# Patient Record
Sex: Male | Born: 1990 | Race: Black or African American | Hispanic: No | Marital: Single
Health system: Southern US, Community
[De-identification: ages and names within clinical notes are randomized; demographics above are authoritative.]

---

## 2012-01-27 ENCOUNTER — Ambulatory Visit (HOSPITAL_COMMUNITY)
Admission: RE | Admit: 2012-01-27 | Discharge: 2012-01-27 | Disposition: A | Discharge status: Home / Self Care | Payer: Commercial Managed Care - PPO | Source: Ambulatory Visit | Attending: Family Medicine | Admitting: Family Medicine

## 2012-01-27 ENCOUNTER — Other Ambulatory Visit: Payer: Self-pay | Admitting: Family Medicine

## 2012-01-27 DIAGNOSIS — M899 Disorder of bone, unspecified: Secondary | ICD-10-CM | POA: Insufficient documentation

## 2012-01-27 DIAGNOSIS — M25562 Pain in left knee: Secondary | ICD-10-CM

## 2012-01-27 DIAGNOSIS — X58XXXA Exposure to other specified factors, initial encounter: Secondary | ICD-10-CM | POA: Insufficient documentation

## 2012-01-27 DIAGNOSIS — M25469 Effusion, unspecified knee: Secondary | ICD-10-CM | POA: Insufficient documentation

## 2012-01-27 DIAGNOSIS — M7120 Synovial cyst of popliteal space [Baker], unspecified knee: Secondary | ICD-10-CM | POA: Insufficient documentation

## 2012-01-27 DIAGNOSIS — M84369A Stress fracture, unspecified tibia and fibula, initial encounter for fracture: Secondary | ICD-10-CM | POA: Insufficient documentation

## 2012-01-27 DIAGNOSIS — R609 Edema, unspecified: Secondary | ICD-10-CM | POA: Insufficient documentation

## 2012-01-27 DIAGNOSIS — M949 Disorder of cartilage, unspecified: Secondary | ICD-10-CM | POA: Insufficient documentation

## 2014-05-20 ENCOUNTER — Ambulatory Visit: Payer: Self-pay

## 2014-05-21 ENCOUNTER — Other Ambulatory Visit (HOSPITAL_COMMUNITY)
Admission: RE | Admit: 2014-05-21 | Discharge: 2014-05-21 | Disposition: A | Discharge status: Home / Self Care | Payer: Commercial Managed Care - PPO | Source: Ambulatory Visit | Attending: Family Medicine | Admitting: Family Medicine

## 2014-05-21 DIAGNOSIS — Z113 Encounter for screening for infections with a predominantly sexual mode of transmission: Secondary | ICD-10-CM | POA: Diagnosis present

## 2015-02-07 ENCOUNTER — Emergency Department (HOSPITAL_COMMUNITY): Payer: BLUE CROSS/BLUE SHIELD

## 2015-02-07 ENCOUNTER — Emergency Department (HOSPITAL_COMMUNITY)
Admission: EM | Admit: 2015-02-07 | Discharge: 2015-02-07 | Disposition: A | Discharge status: Home / Self Care | Payer: BLUE CROSS/BLUE SHIELD | Attending: Emergency Medicine | Admitting: Emergency Medicine

## 2015-02-07 ENCOUNTER — Encounter (HOSPITAL_COMMUNITY): Payer: Self-pay | Admitting: *Deleted

## 2015-02-07 DIAGNOSIS — W3400XA Accidental discharge from unspecified firearms or gun, initial encounter: Secondary | ICD-10-CM | POA: Insufficient documentation

## 2015-02-07 DIAGNOSIS — S023XXA Fracture of orbital floor, initial encounter for closed fracture: Secondary | ICD-10-CM | POA: Diagnosis not present

## 2015-02-07 DIAGNOSIS — Y9389 Activity, other specified: Secondary | ICD-10-CM | POA: Diagnosis not present

## 2015-02-07 DIAGNOSIS — Z23 Encounter for immunization: Secondary | ICD-10-CM | POA: Diagnosis not present

## 2015-02-07 DIAGNOSIS — S0120XA Unspecified open wound of nose, initial encounter: Secondary | ICD-10-CM | POA: Insufficient documentation

## 2015-02-07 DIAGNOSIS — Y9289 Other specified places as the place of occurrence of the external cause: Secondary | ICD-10-CM | POA: Insufficient documentation

## 2015-02-07 DIAGNOSIS — S0992XA Unspecified injury of nose, initial encounter: Secondary | ICD-10-CM | POA: Diagnosis present

## 2015-02-07 DIAGNOSIS — Y998 Other external cause status: Secondary | ICD-10-CM | POA: Insufficient documentation

## 2015-02-07 DIAGNOSIS — R040 Epistaxis: Secondary | ICD-10-CM

## 2015-02-07 DIAGNOSIS — S02401A Maxillary fracture, unspecified, initial encounter for closed fracture: Secondary | ICD-10-CM | POA: Diagnosis not present

## 2015-02-07 LAB — I-STAT CHEM 8, ED
BUN: 12 mg/dL (ref 6–20)
BUN: 23 mg/dL — ABNORMAL HIGH (ref 6–20)
CREATININE: 1.4 mg/dL — AB (ref 0.61–1.24)
Calcium, Ion: 0.69 mmol/L — ABNORMAL LOW (ref 1.12–1.23)
Calcium, Ion: 1.18 mmol/L (ref 1.12–1.23)
Chloride: 118 mmol/L — ABNORMAL HIGH (ref 101–111)
Chloride: 98 mmol/L — ABNORMAL LOW (ref 101–111)
Creatinine, Ser: 0.5 mg/dL — ABNORMAL LOW (ref 0.61–1.24)
GLUCOSE: 87 mg/dL (ref 65–99)
Glucose, Bld: 50 mg/dL — ABNORMAL LOW (ref 65–99)
HCT: 21 % — ABNORMAL LOW (ref 39.0–52.0)
HEMATOCRIT: 45 % (ref 39.0–52.0)
HEMOGLOBIN: 7.1 g/dL — AB (ref 13.0–17.0)
HEMOGLOBIN: 15.3 g/dL (ref 13.0–17.0)
POTASSIUM: 2.4 mmol/L — AB (ref 3.5–5.1)
POTASSIUM: 3.6 mmol/L (ref 3.5–5.1)
SODIUM: 151 mmol/L — AB (ref 135–145)
Sodium: 139 mmol/L (ref 135–145)
TCO2: 14 mmol/L (ref 0–100)
TCO2: 26 mmol/L (ref 0–100)

## 2015-02-07 LAB — COMPREHENSIVE METABOLIC PANEL
ALBUMIN: 4.2 g/dL (ref 3.5–5.0)
ALK PHOS: 45 U/L (ref 38–126)
ALT: 14 U/L — ABNORMAL LOW (ref 17–63)
ANION GAP: 7 (ref 5–15)
AST: 22 U/L (ref 15–41)
BUN: 16 mg/dL (ref 6–20)
CALCIUM: 9.3 mg/dL (ref 8.9–10.3)
CHLORIDE: 101 mmol/L (ref 101–111)
CO2: 31 mmol/L (ref 22–32)
Creatinine, Ser: 1.47 mg/dL — ABNORMAL HIGH (ref 0.61–1.24)
GFR calc Af Amer: >60 mL/min (ref >60)
GFR calc non Af Amer: >60 mL/min (ref >60)
GLUCOSE: 103 mg/dL — AB (ref 65–99)
POTASSIUM: 3.3 mmol/L — AB (ref 3.5–5.1)
SODIUM: 139 mmol/L (ref 135–145)
Total Bilirubin: 0.6 mg/dL (ref 0.3–1.2)
Total Protein: 7.5 g/dL (ref 6.5–8.1)

## 2015-02-07 LAB — CBC
HEMATOCRIT: 44.5 % (ref 39.0–52.0)
HEMOGLOBIN: 15.1 g/dL (ref 13.0–17.0)
MCH: 29.4 pg (ref 26.0–34.0)
MCHC: 33.9 g/dL (ref 30.0–36.0)
MCV: 86.7 fL (ref 78.0–100.0)
Platelets: 185 10*3/uL (ref 150–400)
RBC: 5.13 MIL/uL (ref 4.22–5.81)
RDW: 13.5 % (ref 11.5–15.5)
WBC: 5 10*3/uL (ref 4.0–10.5)

## 2015-02-07 LAB — PREPARE FRESH FROZEN PLASMA
Unit division: 0
Unit division: 0

## 2015-02-07 LAB — CDS SEROLOGY

## 2015-02-07 LAB — SAMPLE TO BLOOD BANK

## 2015-02-07 LAB — PROTIME-INR
INR: 1.26 (ref 0.00–1.49)
Prothrombin Time: 16 seconds — ABNORMAL HIGH (ref 11.6–15.2)

## 2015-02-07 LAB — I-STAT CG4 LACTIC ACID, ED: LACTIC ACID, VENOUS: 0.84 mmol/L (ref 0.5–2.0)

## 2015-02-07 LAB — ETHANOL: Alcohol, Ethyl (B): <5 mg/dL (ref <5)

## 2015-02-07 MED ORDER — CLINDAMYCIN HCL 300 MG PO CAPS: 300.0000 mg | ORAL_CAPSULE | Freq: Three times a day (TID) | ORAL | Status: AC

## 2015-02-07 MED ORDER — ONDANSETRON HCL 4 MG/2ML IJ SOLN
4.0000 mg | Freq: Once | INTRAMUSCULAR | Status: AC
  Administered 2015-02-07: 4 mg via INTRAVENOUS
  Filled 2015-02-07: qty 2

## 2015-02-07 MED ORDER — TETANUS-DIPHTH-ACELL PERTUSSIS 5-2.5-18.5 LF-MCG/0.5 IM SUSP
0.5000 mL | Freq: Once | INTRAMUSCULAR | Status: AC
  Administered 2015-02-07: 0.5 mL via INTRAMUSCULAR
  Filled 2015-02-07: qty 0.5

## 2015-02-07 MED ORDER — OXYCODONE-ACETAMINOPHEN 5-325 MG PO TABS
1.0000 | ORAL_TABLET | Freq: Once | ORAL | Status: AC
  Administered 2015-02-07: 1 via ORAL
  Filled 2015-02-07: qty 1

## 2015-02-07 MED ORDER — SUCCINYLCHOLINE CHLORIDE 20 MG/ML IJ SOLN
INTRAMUSCULAR | Status: AC
  Filled 2015-02-07: qty 1

## 2015-02-07 MED ORDER — ETOMIDATE 2 MG/ML IV SOLN
INTRAVENOUS | Status: DC
Start: 2015-02-07 — End: 2015-02-07
  Filled 2015-02-07: qty 20

## 2015-02-07 MED ORDER — ROCURONIUM BROMIDE 50 MG/5ML IV SOLN
INTRAVENOUS | Status: AC
  Filled 2015-02-07: qty 2

## 2015-02-07 MED ORDER — OXYCODONE-ACETAMINOPHEN 5-325 MG PO TABS: 1.0000 | ORAL_TABLET | ORAL | Status: AC | PRN

## 2015-02-07 MED ORDER — IOHEXOL 300 MG/ML  SOLN
80.0000 mL | Freq: Once | INTRAMUSCULAR | Status: AC | PRN
  Administered 2015-02-07: 80 mL via INTRAVENOUS

## 2015-02-07 MED ORDER — IBUPROFEN 600 MG PO TABS: 600.0000 mg | ORAL_TABLET | Freq: Three times a day (TID) | ORAL | Status: AC | PRN

## 2015-02-07 MED ORDER — FENTANYL CITRATE (PF) 100 MCG/2ML IJ SOLN
50.0000 ug | Freq: Once | INTRAMUSCULAR | Status: AC
  Administered 2015-02-07: 50 ug via INTRAVENOUS
  Filled 2015-02-07: qty 2

## 2015-02-07 MED ORDER — LIDOCAINE HCL (CARDIAC) 20 MG/ML IV SOLN
INTRAVENOUS | Status: AC
  Filled 2015-02-07: qty 5
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
## 2015-02-07 NOTE — Discharge Instructions (Signed)
Facial Fracture °A facial fracture is a break in one of the bones of your face. °HOME CARE INSTRUCTIONS  °· Protect the injured part of your face until it is healed. °· Do not participate in activities which give chance for re-injury until your doctor approves. °· Gently wash and dry your face. °· Wear head and facial protection while riding a bicycle, motorcycle, or snowmobile. °SEEK MEDICAL CARE IF:  °· An oral temperature above 102° F (38.9° C) develops. °· You have severe headaches or notice changes in your vision. °· You have new numbness or tingling in your face. °· You develop nausea (feeling sick to your stomach), vomiting or a stiff neck. °SEEK IMMEDIATE MEDICAL CARE IF:  °· You develop difficulty seeing or experience double vision. °· You become dizzy, lightheaded, or faint. °· You develop trouble speaking, breathing, or swallowing. °· You have a watery discharge from your nose or ear. °MAKE SURE YOU:  °· Understand these instructions. °· Will watch your condition. °· Will get help right away if you are not doing well or get worse. °Document Released: 05/24/2005 Document Revised: 08/16/2011 Document Reviewed: 01/11/2008 °[employer]® Patient Information ©2015 [employer]. This information is not intended to replace advice given to you by your health care provider. Make sure you discuss any questions you have with your health care provider. ° ° ° °
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
## 2015-02-12 ENCOUNTER — Emergency Department (HOSPITAL_COMMUNITY)
Admission: EM | Admit: 2015-02-12 | Discharge: 2015-02-12 | Discharge status: Absent without leave | Payer: Commercial Managed Care - PPO
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
## 2016-06-30 ENCOUNTER — Other Ambulatory Visit: Payer: Self-pay | Admitting: Family Medicine

## 2016-06-30 ENCOUNTER — Other Ambulatory Visit (HOSPITAL_COMMUNITY)
Admission: RE | Admit: 2016-06-30 | Discharge: 2016-06-30 | Disposition: A | Discharge status: Home / Self Care | Payer: BLUE CROSS/BLUE SHIELD | Source: Ambulatory Visit | Attending: Family Medicine | Admitting: Family Medicine

## 2016-06-30 DIAGNOSIS — Z113 Encounter for screening for infections with a predominantly sexual mode of transmission: Secondary | ICD-10-CM | POA: Diagnosis not present

## 2016-07-06 LAB — URINE CYTOLOGY ANCILLARY ONLY
BACTERIAL VAGINITIS: NEGATIVE
CANDIDA VAGINITIS: NEGATIVE
CHLAMYDIA, DNA PROBE: NEGATIVE
NEISSERIA GONORRHEA: NEGATIVE
TRICH (WINDOWPATH): NEGATIVE

## 2019-01-08 ENCOUNTER — Other Ambulatory Visit: Payer: Self-pay

## 2019-01-08 DIAGNOSIS — Z20822 Contact with and (suspected) exposure to covid-19: Secondary | ICD-10-CM

## 2019-01-09 LAB — NOVEL CORONAVIRUS, NAA: SARS-CoV-2, NAA: NOT DETECTED

## 2019-01-10 ENCOUNTER — Telehealth: Payer: Self-pay | Admitting: General Practice

## 2019-01-10 NOTE — Telephone Encounter (Signed)
Pt received negative test results  °

## 2021-02-17 ENCOUNTER — Other Ambulatory Visit: Payer: Self-pay | Admitting: Family Medicine

## 2021-02-17 ENCOUNTER — Other Ambulatory Visit (HOSPITAL_COMMUNITY)
Admission: RE | Admit: 2021-02-17 | Discharge: 2021-02-17 | Disposition: A | Discharge status: Home / Self Care | Payer: Commercial Managed Care - PPO | Source: Ambulatory Visit | Attending: Family Medicine | Admitting: Family Medicine

## 2021-02-17 DIAGNOSIS — N898 Other specified noninflammatory disorders of vagina: Secondary | ICD-10-CM | POA: Insufficient documentation

## 2021-02-18 LAB — MOLECULAR ANCILLARY ONLY
Chlamydia: POSITIVE — AB
Comment: NEGATIVE
Comment: NEGATIVE
Comment: NORMAL
Neisseria Gonorrhea: NEGATIVE
Trichomonas: NEGATIVE
